# Patient Record
Sex: Male | Born: 1990 | Race: Black or African American | Hispanic: No | Smoking: Current every day smoker
Health system: Southern US, Community
[De-identification: ages and names within clinical notes are randomized; demographics above are authoritative.]

---
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
## 2010-07-25 ENCOUNTER — Emergency Department (HOSPITAL_COMMUNITY)
Admission: EM | Admit: 2010-07-25 | Discharge: 2010-07-26 | Disposition: A | Discharge status: Home / Self Care | Payer: Self-pay | Attending: Emergency Medicine | Admitting: Emergency Medicine

## 2010-07-25 DIAGNOSIS — R0602 Shortness of breath: Secondary | ICD-10-CM | POA: Insufficient documentation

## 2010-07-25 DIAGNOSIS — R0989 Other specified symptoms and signs involving the circulatory and respiratory systems: Secondary | ICD-10-CM | POA: Insufficient documentation

## 2010-07-25 DIAGNOSIS — F411 Generalized anxiety disorder: Secondary | ICD-10-CM | POA: Insufficient documentation

## 2010-07-25 DIAGNOSIS — R209 Unspecified disturbances of skin sensation: Secondary | ICD-10-CM | POA: Insufficient documentation

## 2010-07-25 DIAGNOSIS — R0789 Other chest pain: Secondary | ICD-10-CM | POA: Insufficient documentation

## 2010-07-25 DIAGNOSIS — R0609 Other forms of dyspnea: Secondary | ICD-10-CM | POA: Insufficient documentation

## 2010-07-25 DIAGNOSIS — R51 Headache: Secondary | ICD-10-CM | POA: Insufficient documentation

## 2010-07-25 DIAGNOSIS — F1210 Cannabis abuse, uncomplicated: Secondary | ICD-10-CM | POA: Insufficient documentation

## 2010-07-25 LAB — GLUCOSE, CAPILLARY: Glucose-Capillary: 81 mg/dL (ref 70–99)

## 2010-07-26 ENCOUNTER — Emergency Department (HOSPITAL_COMMUNITY): Payer: Self-pay

## 2010-07-26 ENCOUNTER — Encounter (HOSPITAL_COMMUNITY): Payer: Self-pay

## 2010-07-26 LAB — POCT I-STAT, CHEM 8
BUN: 13 mg/dL (ref 6–23)
Calcium, Ion: 1.17 mmol/L (ref 1.12–1.32)
Chloride: 104 mEq/L (ref 96–112)
Creatinine, Ser: 1.2 mg/dL (ref 0.50–1.35)
Glucose, Bld: 87 mg/dL (ref 70–99)
HCT: 56 % — ABNORMAL HIGH (ref 39.0–52.0)
Hemoglobin: 19 g/dL — ABNORMAL HIGH (ref 13.0–17.0)
Potassium: 3.6 mEq/L (ref 3.5–5.1)
Sodium: 142 mEq/L (ref 135–145)
TCO2: 26 mmol/L (ref 0–100)

## 2010-07-26 LAB — URINE MICROSCOPIC-ADD ON

## 2010-07-26 LAB — URINALYSIS, ROUTINE W REFLEX MICROSCOPIC
Hgb urine dipstick: NEGATIVE
Leukocytes, UA: NEGATIVE
Nitrite: NEGATIVE
Protein, ur: 100 mg/dL — AB
Urobilinogen, UA: 1 mg/dL (ref 0.0–1.0)

## 2010-07-26 LAB — RAPID URINE DRUG SCREEN, HOSP PERFORMED
Amphetamines: NOT DETECTED
Barbiturates: NOT DETECTED
Tetrahydrocannabinol: POSITIVE — AB

## 2013-09-03 ENCOUNTER — Encounter (HOSPITAL_COMMUNITY): Payer: Self-pay | Admitting: Emergency Medicine

## 2013-09-03 ENCOUNTER — Emergency Department (HOSPITAL_COMMUNITY): Payer: Self-pay | Admitting: Anesthesiology

## 2013-09-03 ENCOUNTER — Encounter (HOSPITAL_COMMUNITY)
Admission: EM | Disposition: A | Discharge status: Home / Self Care | Payer: Self-pay | Source: Home / Self Care | Attending: Emergency Medicine

## 2013-09-03 ENCOUNTER — Emergency Department (HOSPITAL_COMMUNITY): Payer: Self-pay

## 2013-09-03 ENCOUNTER — Encounter (HOSPITAL_COMMUNITY): Payer: Self-pay | Admitting: Anesthesiology

## 2013-09-03 ENCOUNTER — Ambulatory Visit (HOSPITAL_COMMUNITY)
Admission: EM | Admit: 2013-09-03 | Discharge: 2013-09-03 | Disposition: A | Discharge status: Home / Self Care | Payer: Self-pay | Attending: Emergency Medicine | Admitting: Emergency Medicine

## 2013-09-03 DIAGNOSIS — Z91018 Allergy to other foods: Secondary | ICD-10-CM | POA: Insufficient documentation

## 2013-09-03 DIAGNOSIS — S02609A Fracture of mandible, unspecified, initial encounter for closed fracture: Secondary | ICD-10-CM | POA: Insufficient documentation

## 2013-09-03 DIAGNOSIS — R6884 Jaw pain: Secondary | ICD-10-CM

## 2013-09-03 DIAGNOSIS — Z91013 Allergy to seafood: Secondary | ICD-10-CM | POA: Insufficient documentation

## 2013-09-03 DIAGNOSIS — F17200 Nicotine dependence, unspecified, uncomplicated: Secondary | ICD-10-CM | POA: Insufficient documentation

## 2013-09-03 HISTORY — PX: ORIF MANDIBULAR FRACTURE: SHX2127

## 2013-09-03 SURGERY — OPEN REDUCTION INTERNAL FIXATION (ORIF) MANDIBULAR FRACTURE
Anesthesia: General | Site: Mouth | Laterality: Right

## 2013-09-03 MED ORDER — VECURONIUM BROMIDE 10 MG IV SOLR
INTRAVENOUS | Status: DC | PRN
  Administered 2013-09-03: 4 mg via INTRAVENOUS

## 2013-09-03 MED ORDER — OXYCODONE HCL 5 MG/5ML PO SOLN: 5.0000 mg | Freq: Once | ORAL | Status: AC | PRN

## 2013-09-03 MED ORDER — OXYMETAZOLINE HCL 0.05 % NA SOLN
NASAL | Status: DC | PRN
  Administered 2013-09-03: 2 via NASAL

## 2013-09-03 MED ORDER — HYDROMORPHONE HCL PF 1 MG/ML IJ SOLN
0.2500 mg | INTRAMUSCULAR | Status: DC | PRN
Start: 2013-09-03 — End: 2013-09-03
  Administered 2013-09-03 (×2): 0.5 mg via INTRAVENOUS

## 2013-09-03 MED ORDER — NEOSTIGMINE METHYLSULFATE 10 MG/10ML IV SOLN
INTRAVENOUS | Status: DC | PRN
  Administered 2013-09-03: 5 mg via INTRAVENOUS

## 2013-09-03 MED ORDER — BACITRACIN ZINC 500 UNIT/GM EX OINT
TOPICAL_OINTMENT | CUTANEOUS | Status: AC
  Filled 2013-09-03: qty 15

## 2013-09-03 MED ORDER — METOCLOPRAMIDE HCL 5 MG/ML IJ SOLN
INTRAMUSCULAR | Status: AC
  Filled 2013-09-03: qty 2

## 2013-09-03 MED ORDER — PROMETHAZINE HCL 25 MG/ML IJ SOLN: 6.2500 mg | INTRAMUSCULAR | Status: DC | PRN

## 2013-09-03 MED ORDER — FENTANYL CITRATE 0.05 MG/ML IJ SOLN
INTRAMUSCULAR | Status: DC | PRN
  Administered 2013-09-03: 100 ug via INTRAVENOUS
  Administered 2013-09-03: 50 ug via INTRAVENOUS
  Administered 2013-09-03: 100 ug via INTRAVENOUS

## 2013-09-03 MED ORDER — FENTANYL CITRATE 0.05 MG/ML IJ SOLN
INTRAMUSCULAR | Status: AC
  Filled 2013-09-03: qty 5

## 2013-09-03 MED ORDER — DEXAMETHASONE SODIUM PHOSPHATE 10 MG/ML IJ SOLN
INTRAMUSCULAR | Status: DC | PRN
  Administered 2013-09-03: 10 mg via INTRAVENOUS

## 2013-09-03 MED ORDER — LACTATED RINGERS IV SOLN
INTRAVENOUS | Status: DC | PRN
  Administered 2013-09-03 (×2): via INTRAVENOUS

## 2013-09-03 MED ORDER — MIDAZOLAM HCL 2 MG/2ML IJ SOLN
INTRAMUSCULAR | Status: AC
  Filled 2013-09-03: qty 2

## 2013-09-03 MED ORDER — HYDROMORPHONE HCL PF 1 MG/ML IJ SOLN
INTRAMUSCULAR | Status: AC
  Administered 2013-09-03: 0.5 mg via INTRAVENOUS
  Filled 2013-09-03: qty 1

## 2013-09-03 MED ORDER — GLYCOPYRROLATE 0.2 MG/ML IJ SOLN
INTRAMUSCULAR | Status: DC | PRN
  Administered 2013-09-03: 0.6 mg via INTRAVENOUS

## 2013-09-03 MED ORDER — CLINDAMYCIN PHOSPHATE 900 MG/50ML IV SOLN
INTRAVENOUS | Status: DC | PRN
  Administered 2013-09-03: 900 mg via INTRAVENOUS

## 2013-09-03 MED ORDER — DEXTROSE 5 % IV SOLN
INTRAVENOUS | Status: DC | PRN
  Administered 2013-09-03: 17:00:00 via INTRAVENOUS

## 2013-09-03 MED ORDER — SUCCINYLCHOLINE CHLORIDE 20 MG/ML IJ SOLN
INTRAMUSCULAR | Status: AC
  Filled 2013-09-03: qty 1

## 2013-09-03 MED ORDER — ONDANSETRON HCL 4 MG/2ML IJ SOLN
INTRAMUSCULAR | Status: DC | PRN
  Administered 2013-09-03: 4 mg via INTRAVENOUS

## 2013-09-03 MED ORDER — VECURONIUM BROMIDE 10 MG IV SOLR
INTRAVENOUS | Status: AC
  Filled 2013-09-03: qty 10

## 2013-09-03 MED ORDER — ARTIFICIAL TEARS OP OINT
TOPICAL_OINTMENT | OPHTHALMIC | Status: DC | PRN
  Administered 2013-09-03: 1 via OPHTHALMIC

## 2013-09-03 MED ORDER — MORPHINE SULFATE 4 MG/ML IJ SOLN
4.0000 mg | Freq: Once | INTRAMUSCULAR | Status: AC
  Administered 2013-09-03: 4 mg via INTRAVENOUS
  Filled 2013-09-03: qty 1

## 2013-09-03 MED ORDER — SUCCINYLCHOLINE CHLORIDE 20 MG/ML IJ SOLN
INTRAMUSCULAR | Status: DC | PRN
  Administered 2013-09-03: 120 mg via INTRAVENOUS
  Administered 2013-09-03: 40 mg via INTRAVENOUS

## 2013-09-03 MED ORDER — 0.9 % SODIUM CHLORIDE (POUR BTL) OPTIME
TOPICAL | Status: DC | PRN
  Administered 2013-09-03: 1000 mL

## 2013-09-03 MED ORDER — OXYMETAZOLINE HCL 0.05 % NA SOLN
NASAL | Status: AC
  Filled 2013-09-03: qty 15

## 2013-09-03 MED ORDER — MIDAZOLAM HCL 5 MG/5ML IJ SOLN
INTRAMUSCULAR | Status: DC | PRN
  Administered 2013-09-03: 2 mg via INTRAVENOUS

## 2013-09-03 MED ORDER — LIDOCAINE-EPINEPHRINE 1 %-1:100000 IJ SOLN
INTRAMUSCULAR | Status: DC | PRN
  Administered 2013-09-03: 10 mL

## 2013-09-03 MED ORDER — DEXAMETHASONE SODIUM PHOSPHATE 10 MG/ML IJ SOLN
INTRAMUSCULAR | Status: AC
  Filled 2013-09-03: qty 1

## 2013-09-03 MED ORDER — PROPOFOL 10 MG/ML IV BOLUS
INTRAVENOUS | Status: AC
  Filled 2013-09-03: qty 20

## 2013-09-03 MED ORDER — PROPOFOL 10 MG/ML IV BOLUS
INTRAVENOUS | Status: DC | PRN
  Administered 2013-09-03: 100 mg via INTRAVENOUS
  Administered 2013-09-03: 50 mg via INTRAVENOUS
  Administered 2013-09-03: 150 mg via INTRAVENOUS

## 2013-09-03 MED ORDER — LIDOCAINE HCL (CARDIAC) 20 MG/ML IV SOLN
INTRAVENOUS | Status: DC | PRN
  Administered 2013-09-03: 100 mg via INTRAVENOUS

## 2013-09-03 MED ORDER — CLINDAMYCIN PHOSPHATE 900 MG/50ML IV SOLN
INTRAVENOUS | Status: AC
  Filled 2013-09-03: qty 50

## 2013-09-03 MED ORDER — BACITRACIN ZINC 500 UNIT/GM EX OINT
TOPICAL_OINTMENT | CUTANEOUS | Status: DC | PRN
  Administered 2013-09-03: 1 via TOPICAL

## 2013-09-03 MED ORDER — OXYCODONE HCL 5 MG PO TABS
5.0000 mg | ORAL_TABLET | Freq: Once | ORAL | Status: AC | PRN
Start: 2013-09-03 — End: 2013-09-03
  Administered 2013-09-03: 5 mg via ORAL

## 2013-09-03 MED ORDER — OXYCODONE HCL 5 MG PO TABS
ORAL_TABLET | ORAL | Status: AC
  Administered 2013-09-03: 5 mg via ORAL
  Filled 2013-09-03: qty 1

## 2013-09-03 SURGICAL SUPPLY — 46 items
BIT DRILL TWIST 58MM 26MM WL (DRILL) IMPLANT
BLADE SURG ROTATE 9660 (MISCELLANEOUS) IMPLANT
CANISTER SUCTION 2500CC (MISCELLANEOUS) ×3 IMPLANT
CLEANER TIP ELECTROSURG 2X2 (MISCELLANEOUS) ×3 IMPLANT
COVER SURGICAL LIGHT HANDLE (MISCELLANEOUS) ×3 IMPLANT
DECANTER SPIKE VIAL GLASS SM (MISCELLANEOUS) ×3 IMPLANT
DRILL TWIST 58MM 26MM WL (DRILL) ×3
DRSG NASOPORE 8CM (GAUZE/BANDAGES/DRESSINGS) ×1 IMPLANT
ELECT COATED BLADE 2.86 ST (ELECTRODE) ×3 IMPLANT
ELECT REM PT RETURN 9FT ADLT (ELECTROSURGICAL) ×3
ELECTRODE REM PT RTRN 9FT ADLT (ELECTROSURGICAL) ×1 IMPLANT
GLOVE SURG SS PI 7.5 STRL IVOR (GLOVE) ×3 IMPLANT
GOWN STRL REUS W/ TWL LRG LVL3 (GOWN DISPOSABLE) ×2 IMPLANT
GOWN STRL REUS W/TWL LRG LVL3 (GOWN DISPOSABLE) ×6
KIT BASIN OR (CUSTOM PROCEDURE TRAY) ×3 IMPLANT
KIT ROOM TURNOVER OR (KITS) ×3 IMPLANT
NEEDLE 27GAX1X1/2 (NEEDLE) ×3 IMPLANT
NS IRRIG 1000ML POUR BTL (IV SOLUTION) ×3 IMPLANT
PAD ARMBOARD 7.5X6 YLW CONV (MISCELLANEOUS) ×6 IMPLANT
PENCIL BUTTON HOLSTER BLD 10FT (ELECTRODE) ×3 IMPLANT
PLATE 3D 3X2 RECT (Plate) ×2 IMPLANT
PLATE MNDBLE 3D 4X2 SQUARE (Plate) ×2 IMPLANT
SCISSORS WIRE ANG 4 3/4 DISP (INSTRUMENTS) ×3 IMPLANT
SCREW BONE CROSS PIN 2.0X10MM (Screw) ×4 IMPLANT
SCREW MNDBLE 2.3X10MM BONE (Screw) ×16 IMPLANT
SCREW UPPER FACE 2.0X12MM (Screw) ×4 IMPLANT
SCREW UPPER FACE 2.0X8MM (Screw) ×4 IMPLANT
SUT ETHILON 2 0 FS 18 (SUTURE) ×2 IMPLANT
SUT ETHILON 4 0 CL P 3 (SUTURE) IMPLANT
SUT MON AB 3-0 SH 27 (SUTURE) ×6
SUT MON AB 3-0 SH27 (SUTURE) ×2 IMPLANT
SUT PROLENE 6 0 PC 1 (SUTURE) IMPLANT
SUT SILK 2 0 SH (SUTURE) ×4 IMPLANT
SUT SILK 2 0 SH CR/8 (SUTURE) ×2 IMPLANT
SUT STEEL 0 (SUTURE)
SUT STEEL 0 18XMFL TIE 17 (SUTURE) IMPLANT
SUT STEEL 1 (SUTURE) IMPLANT
SUT STEEL 2 (SUTURE) IMPLANT
SUT STEEL 4 (SUTURE) ×2 IMPLANT
SUT VIC AB 3-0 SH 18 (SUTURE) ×2 IMPLANT
SUT VICRYL 4-0 PS2 18IN ABS (SUTURE) IMPLANT
TOWEL OR 17X24 6PK STRL BLUE (TOWEL DISPOSABLE) ×3 IMPLANT
TOWEL OR 17X26 10 PK STRL BLUE (TOWEL DISPOSABLE) ×3 IMPLANT
TRAY ENT MC OR (CUSTOM PROCEDURE TRAY) ×3 IMPLANT
TRAY FOLEY CATH 14FRSI W/METER (CATHETERS) IMPLANT
WATER STERILE IRR 1000ML POUR (IV SOLUTION) ×3 IMPLANT

## 2013-09-03 NOTE — Anesthesia Postprocedure Evaluation (Signed)
  Anesthesia Post-op Note  Patient: Manuel Lee  Procedure(s) Performed: Procedure(s): OPEN REDUCTION INTERNAL FIXATION (ORIF) OF PARASYMPHYSEAL MANDIBULAR FRACTURE WITH MAXILLOMANDIBULAR FIXATION. (Right)  Patient Location: PACU  Anesthesia Type:General  Level of Consciousness: awake and alert   Airway and Oxygen Therapy: Patient Spontanous Breathing  Post-op Pain: mild  Post-op Assessment: Post-op Vital signs reviewed  Post-op Vital Signs: stable  Last Vitals:  Filed Vitals:   09/03/13 1915  BP: 129/73  Pulse: 54  Temp: 36.7 C  Resp: 16    Complications: No apparent anesthesia complications

## 2013-09-03 NOTE — Discharge Instructions (Signed)
Soft, NO-CHEW Or full liquid diet x 6 weeks. Rx on chart for hydrocodone, clindamycin, and zofran. May brush teeth GENTLY and use OTC mouthwash TID. Follow up with Dr. Emeline DarlingGore in 1 to 2 weeks.

## 2013-09-03 NOTE — Anesthesia Procedure Notes (Signed)
Procedure Name: Intubation Date/Time: 09/03/2013 4:41 PM Performed by: Wray KearnsFOLEY, Valon Glasscock A Pre-anesthesia Checklist: Patient identified, Timeout performed, Emergency Drugs available, Suction available and Patient being monitored Patient Re-evaluated:Patient Re-evaluated prior to inductionOxygen Delivery Method: Circle system utilized Preoxygenation: Pre-oxygenation with 100% oxygen Intubation Type: IV induction and Cricoid Pressure applied Ventilation: Mask ventilation without difficulty Laryngoscope Size: Mac Grade View: Grade I Nasal Tubes: Right, Nasal Rae, Magill forceps - small, utilized and Nasal prep performed Tube size: 6.5 mm Number of attempts: 3 Airway Equipment and Method: Video-laryngoscopy Placement Confirmation: ETT inserted through vocal cords under direct vision,  breath sounds checked- equal and bilateral and positive ETCO2 Secured at: 28 cm Dental Injury: Teeth and Oropharynx as per pre-operative assessment  Difficulty Due To: Difficulty was anticipated and Difficult Airway- due to limited oral opening Comments: Attempt Direct Laryngoscopy #4 Mac after nasal prep unsuccessful . Attempt Direct Laryngoscopy per Dr. Jacklynn BueMassagee unsuccessful . Video Laryngoscopy per Dr. Jacklynn BueMassagee successful times one attempt . BBSE check . SaO2 100 % throughout .

## 2013-09-03 NOTE — Op Note (Signed)
09/03/2013  6:10 PM    [name], Manuel  [id number]   Pre-Op Dx:  Comminuted right parasymphyseal  Mandible fracture  Post-op Dx: Comminuted right parasymphyseal  Mandible fracture  Proc: Mandibulo-maxillary fixation with open reduction and internal fixation of right parasymphyseal mandible fracture cpt code [id number]  Surg:  Manuel Lee, Manuel Lee  Anes:  GNT  EBL:  Less than 50mL  Comp:  none  Findings:  A comminuted right parasymphyseal mandible fracture with two left and right segments and an inferior fragment all plated with a 2.0 2-D rectangle 8 hole plate spanning all fragments and eight 10mm 2.3 screws. Identified and preserved all branches of the right mental  (V3) nerve. Class I postoperative occlusion.  Procedure: With the patient in a comfortable supine position, general nasotracheal anesthesia was induced without difficulty.  The face and oral cavity were prepped and draped with chlorhexidene gluconate and the eyes were protected appropriately. Chlorhexidene solution tooth brushing was performed by way of preparation.  This was suctioned clear.  1% Xylocaine with 1:100,000 epinephrine was infiltrated to each side of the pyriform aperture, and on the anterior inferior mandible and along teh inferior midline symphysis and right parasymphysis in anticipation of bicortical screw placement.  Several minutes were allowed for this to take effect.  The jaws were manipulated to reestablish class I occlusion.    Two  8 mm bicortical screws were placed superomedial to the canine roots maxillary, and two 12mm screws were placed inferomedial to the canine roots mandibular.  Hemostasis was observed.  22-gauge stainless steel wire loops were prepared.  The pharynx was suctioned free.  The jaws were manipulated back into class I occlusion.  Two vertical MMF wire loops were applied and gently tightened down.  Good native/pre-morbid class I occlusion was noted.  Tightening was completed.  The wire  ends were cut and then twisted with the ends buried to avoid trauma to the oral vestibule.  Hemostasis was observed.  Next open reduction and internal fixation of the patient's right parasymphyseal mandibular fracture was performed by using the Bovie to incise the gingiva. The mandibular outer cortex was exposed and the periosteum was elevated using the periosteal elevator. The comminuted fracture at the right parasymphysis was exposed. The right mandibular branch of the trigeminal nerve was identified and preserved in its entirety. The fracture was then plated with excellent reduction using a 2-dimensional 8 hole-rectangle shaped 2.0 plate and eight 2.3, 10mm screws. Once excellent reduction was noted the incision was irrigated out and sutured using running locking 3-0 vicryl mucosal sutures. The 8mm and 12mm 4-post MMF screws and wires were removed and he was noted to still be in excellent class I occlusion with the mandible stable, so MMF was left off.  The patient was returned to anesthesia, fully awakened and extubated.  The patient was transferred to the postoperative recovery area in stable condition.  Dr. Melvenia BeamMitchell Elder Lee was present and performed the entire procedure.   Manuel Lee, Manuel Lee 6:10 PM 09/03/2013
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
## 2013-09-03 NOTE — Anesthesia Preprocedure Evaluation (Addendum)
Anesthesia Evaluation  Patient identified by MRN, date of birth, ID band Patient awake    Airway Mallampati: IV  Neck ROM: Limited  Mouth opening: Limited Mouth Opening  Dental  (+) Poor Dentition   Pulmonary Current Smoker,  breath sounds clear to auscultation        Cardiovascular negative cardio ROS  Rhythm:Regular Rate:Normal     Neuro/Psych    GI/Hepatic negative GI ROS, Neg liver ROS,   Endo/Other  negative endocrine ROS  Renal/GU negative Renal ROS     Musculoskeletal   Abdominal   Peds negative pediatric ROS (+)  Hematology negative hematology ROS (+)   Anesthesia Other Findings   Reproductive/Obstetrics                          Anesthesia Physical Anesthesia Plan  ASA: I and emergent  Anesthesia Plan: General   Post-op Pain Management:    Induction: Intravenous  Airway Management Planned: Video Laryngoscope Planned and Nasal ETT  Additional Equipment:   Intra-op Plan:   Post-operative Plan: Extubation in OR  Informed Consent: I have reviewed the patients History and Physical, chart, labs and discussed the procedure including the risks, benefits and alternatives for the proposed anesthesia with the patient or authorized representative who has indicated his/her understanding and acceptance.     Plan Discussed with: CRNA and Surgeon  Anesthesia Plan Comments:         Anesthesia Quick Evaluation

## 2013-09-03 NOTE — Transfer of Care (Signed)
Immediate Anesthesia Transfer of Care Note  Patient: Manuel Lee  Procedure(s) Performed: Procedure(s): OPEN REDUCTION INTERNAL FIXATION (ORIF) OF PARASYMPHYSEAL MANDIBULAR FRACTURE WITH MAXILLOMANDIBULAR FIXATION. (Right)  Patient Location: PACU  Anesthesia Type:General  Level of Consciousness: oriented, sedated, patient cooperative and responds to stimulation  Airway & Oxygen Therapy: Patient Spontanous Breathing and Patient connected to face mask oxygen  Post-op Assessment: Report given to PACU RN, Post -op Vital signs reviewed and stable, Patient moving all extremities and Patient moving all extremities X 4  Post vital signs: Reviewed and stable  Complications: No apparent anesthesia complications

## 2013-09-04 NOTE — ED Provider Notes (Signed)
Medical screening examination/treatment/procedure(s) were performed by non-physician practitioner and as supervising physician I was immediately available for consultation/collaboration.    Katria Botts L Kaiyden Simkin, MD 09/04/13 1107 

## 2013-09-05 NOTE — Discharge Summary (Signed)
09/03/2013  4:38 AM  Date of Admission: 09/03/2013 Date of Discharge:09/03/2013  Discharge MD: Melvenia BeamGore, Shabre Kreher, MD  Admitting ZO:[id number]:Kynisha Memon, Clovis RileyMitchell, MD  Reason for admission/final discharge diagnosis: right parasymphyseal mandible fracture s/p ORIF  Labs:see EPIC  Procedure(s) performed: ORIF right mandible fracture with temporary interdental wiring  Discharge Condition:good  Discharge Exam: class I occlusion, right gingivobuccal incision intact with absorbable sutures, EOMI, PERRLA, CN 2-12 intact and symmetric.  Discharge Instructions: soft, no chew diet x 6 weeks, Rx on chart for pain medication, antibiotic, and zofran. [facility]  Hospital Course: taken to OR for ORIF right parasymphyseal mandible fracture with temporary interdental wiring/MMF. Did well post-op and discharged from PACU.  Melvenia BeamGore, Jakeya Gherardi 4:38 AM 09/03/2013

## 2013-09-06 ENCOUNTER — Encounter (HOSPITAL_COMMUNITY): Payer: Self-pay | Admitting: Otolaryngology
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
## 2020-03-10 ENCOUNTER — Other Ambulatory Visit: Payer: Self-pay

## 2020-03-10 ENCOUNTER — Encounter (HOSPITAL_COMMUNITY): Payer: Self-pay | Admitting: Student

## 2020-03-10 ENCOUNTER — Emergency Department (HOSPITAL_COMMUNITY)
Admission: EM | Admit: 2020-03-10 | Discharge: 2020-03-10 | Disposition: A | Discharge status: Home / Self Care | Payer: Self-pay | Attending: Emergency Medicine | Admitting: Emergency Medicine

## 2020-03-10 DIAGNOSIS — K047 Periapical abscess without sinus: Secondary | ICD-10-CM | POA: Insufficient documentation

## 2020-03-10 DIAGNOSIS — F17200 Nicotine dependence, unspecified, uncomplicated: Secondary | ICD-10-CM | POA: Insufficient documentation

## 2020-03-10 MED ORDER — AMOXICILLIN-POT CLAVULANATE 875-125 MG PO TABS: 1.0000 | ORAL_TABLET | Freq: Two times a day (BID) | ORAL | 0 refills | Status: AC

## 2020-03-10 MED ORDER — NAPROXEN 500 MG PO TABS: 500.0000 mg | ORAL_TABLET | Freq: Two times a day (BID) | ORAL | 0 refills | Status: AC | PRN

## 2020-03-10 NOTE — ED Provider Notes (Addendum)
[facility] EMERGENCY DEPARTMENT Provider Note   CSN: [id number] Arrival date & time: 03/10/20  1002     History Chief Complaint  Patient presents with  . Dental Pain    Manuel Lee is a 30 y.o. male who presents to the ED with complaints of L upper dental pain that started last night. Patient states he had a tooth ache to the left upper molar region last night, woke up this AM with mostly resolved pain but did not L facial swelling. No alleviating/aggravating factors. Denies fever, chills, intra-oral drainage, dysphagia, sore throat, neck pain/stiffness, or swelling beneath the tongue.   HPI     History reviewed. No pertinent past medical history.  There are no problems to display for this patient.   Past Surgical History:  Procedure Laterality Date  . ORIF MANDIBULAR FRACTURE Right 09/03/2013   Procedure: OPEN REDUCTION INTERNAL FIXATION (ORIF) OF PARASYMPHYSEAL MANDIBULAR FRACTURE WITH MAXILLOMANDIBULAR FIXATION.;  Surgeon: Melvenia Beam, MD;  Location: [facility] OR;  Service: ENT;  Laterality: Right;       History reviewed. No pertinent family history.  Social History   Tobacco Use  . Smoking status: Current Every Day Smoker  Substance Use Topics  . Alcohol use: Yes  . Drug use: Yes    Types: Marijuana    Home Medications Prior to Admission medications   Medication Sig Start Date End Date Taking? Authorizing Provider  naproxen sodium (ANAPROX) 220 MG tablet Take 440 mg by mouth daily as needed (for pain).    [provider]    Allergies    Other and Scallops [shellfish allergy]  Review of Systems   Review of Systems  Constitutional: Negative for chills and fever.  HENT: Positive for dental problem and facial swelling. Negative for sore throat, trouble swallowing and voice change.   Eyes: Negative for visual disturbance.  Respiratory: Negative for cough and shortness of breath.   Gastrointestinal: Negative for abdominal pain,  nausea and vomiting.  Musculoskeletal: Negative for neck pain and neck stiffness.  Neurological: Negative for syncope.  All other systems reviewed and are negative.   Physical Exam Updated Vital Signs BP (!) 150/82 (BP Location: Right Arm)   Pulse 70   Temp 98.3 F (36.8 C) (Oral)   Resp 18   SpO2 100%   Physical Exam Vitals and nursing note reviewed.  Constitutional:      General: He is not in acute distress.    Appearance: He is well-developed and well-nourished. He is not toxic-appearing.  HENT:     Head: Normocephalic and atraumatic.     Right Ear: Ear canal normal. Tympanic membrane is not perforated, erythematous, retracted or bulging.     Left Ear: Ear canal normal. Tympanic membrane is not perforated, erythematous, retracted or bulging.     Ears:     Comments: No mastoid erythema/swellng/tenderness.     Nose: Nose normal.     Right Sinus: No maxillary sinus tenderness or frontal sinus tenderness.     Left Sinus: No maxillary sinus tenderness or frontal sinus tenderness.     Mouth/Throat:     Mouth: Oropharynx is clear and moist.     Pharynx: Oropharynx is clear. Uvula midline. No oropharyngeal exudate or posterior oropharyngeal erythema.      Comments: Mild left lower maxillary facial swelling without overlying skin erythema/fluctuance/induration.  Posterior oropharynx is symmetric appearing. Patient tolerating own secretions without difficulty. No trismus. No drooling. No hot potato voice. No swelling beneath the tongue, submandibular  compartment is soft.  Eyes:     General:        Right eye: No discharge.        Left eye: No discharge.     Extraocular Movements: Extraocular movements intact.     Conjunctiva/sclera: Conjunctivae normal.     Pupils: Pupils are equal, round, and reactive to light.  Cardiovascular:     Rate and Rhythm: Normal rate and regular rhythm.  Pulmonary:     Effort: Pulmonary effort is normal. No respiratory distress.     Breath sounds:  Normal breath sounds. No wheezing, rhonchi or rales.  Abdominal:     General: There is no distension.     Palpations: Abdomen is soft.     Tenderness: There is no abdominal tenderness.  Musculoskeletal:     Cervical back: Normal range of motion and neck supple. No edema, erythema, rigidity or crepitus.  Lymphadenopathy:     Cervical: No cervical adenopathy.  Skin:    General: Skin is warm and dry.     Findings: No rash.  Neurological:     Mental Status: He is alert.     Comments: Clear speech.   Psychiatric:        Mood and Affect: Mood and affect normal.        Behavior: Behavior normal.        Thought Content: Thought content normal.     ED Results / Procedures / Treatments   Labs (all labs ordered are listed, but only abnormal results are displayed) Labs Reviewed - No data to display  EKG None  Radiology No results found.  Procedures Procedures   Medications Ordered in ED Medications - No data to display  ED Course  I have reviewed the triage vital signs and the nursing notes.  Pertinent labs & imaging results that were available during my care of the patient were reviewed by me and considered in my medical decision making (see chart for details).    MDM Rules/Calculators/A&P                         Patient presents with dental pain. Chart / nursing notes reviewed for additional hx.  Patient is nontoxic appearing, vitals without significant abnormality- BP elevated- doubt HTN emergency. No gross abscess.  Exam unconcerning for Ludwig's angina or deep space infection @ this time.  Will treat with Augmentin and Naproxen.  Urged patient to follow-up with dentist, dental resources were provided.  Discussed treatment plan and need for follow up as well as return precautions. Provided opportunity for questions, patient confirmed understanding and is agreeable to plan.  Final Clinical Impression(s) / ED Diagnoses Final diagnoses:  Dental infection    Rx / DC  Orders ED Discharge Orders         Ordered    amoxicillin-clavulanate (AUGMENTIN) 875-125 MG tablet  Every 12 hours        03/10/20 1117    naproxen (NAPROSYN) 500 MG tablet  2 times daily PRN        03/10/20 1117           Kaleyah Labreck, Vance R, PA-C 03/10/20 [address], [place], PA-C 03/10/20 1158    Terrilee Files, MD 03/10/20 1743

## 2020-03-10 NOTE — Discharge Instructions (Signed)
Call one of the dentists offices provided to schedule an appointment for re-evaluation and further management within the next 48 hours.   I have prescribed you Augmentin which is an antibiotic to treat the infection and Naproxen which is an anti-inflammatory medicine to treat the pain.   Please take all of your antibiotics until finished. You may develop abdominal discomfort or diarrhea from the antibiotic.  You may help offset this with probiotics which you can buy at the store (ask your pharmacist if unable to find) or get probiotics in the form of eating yogurt. Do not eat or take the probiotics until 2 hours after your antibiotic. If you are unable to tolerate these side effects follow-up with your primary care provider or return to the emergency department.   If you begin to experience any blistering, rashes, swelling, or difficulty breathing seek medical care for evaluation of potentially more serious side effects.   Be sure to eat something when taking the Naproxen as it can cause stomach upset and at worst stomach bleeding. Do not take additional non steroidal anti-inflammatory medicines such as Ibuprofen, Aleve, Advil, Mobic, Diclofenac, or goodie powder while taking Naproxen. You may supplement with Tylenol.   We have prescribed you new medication(s) today. Discuss the medications prescribed today with your pharmacist as they can have adverse effects and interactions with your other medicines including over the counter and prescribed medications. Seek medical evaluation if you start to experience new or abnormal symptoms after taking one of these medicines, seek care immediately if you start to experience difficulty breathing, feeling of your throat closing, facial swelling, or rash as these could be indications of a more serious allergic reaction  If you start to experience and new or worsening symptoms return to the emergency department. If you start to experience fever, chills, neck  stiffness/pain, or inability to move your neck or open your mouth come back to the emergency department immediately.   Your blood pressure was noted to be elevated in the Er today- please have this rechecked by your primary care provider within 1-2 weeks.

## 2020-03-10 NOTE — ED Triage Notes (Addendum)
C/o L upper dental pain x 2 days with swelling since yesterday.  Denies pain at present.

## 2022-01-27 ENCOUNTER — Ambulatory Visit: Admission: EM | Admit: 2022-01-27 | Discharge: 2022-01-27 | Discharge status: Against Medical Advice | Payer: Self-pay
